# Patient Record
Sex: Female | Born: 1980 | Hispanic: No | Smoking: Current every day smoker
Health system: Southern US, Community
[De-identification: ages and names within clinical notes are randomized; demographics above are authoritative.]

## PROBLEM LIST (undated history)

## (undated) DIAGNOSIS — D649 Anemia, unspecified: Secondary | ICD-10-CM

---

## 2020-04-28 ENCOUNTER — Emergency Department: Payer: Self-pay

## 2020-04-28 ENCOUNTER — Other Ambulatory Visit: Payer: Self-pay

## 2020-04-28 DIAGNOSIS — R109 Unspecified abdominal pain: Secondary | ICD-10-CM | POA: Insufficient documentation

## 2020-04-28 DIAGNOSIS — Z5321 Procedure and treatment not carried out due to patient leaving prior to being seen by health care provider: Secondary | ICD-10-CM | POA: Insufficient documentation

## 2020-04-28 DIAGNOSIS — N898 Other specified noninflammatory disorders of vagina: Secondary | ICD-10-CM | POA: Insufficient documentation

## 2020-04-28 DIAGNOSIS — R05 Cough: Secondary | ICD-10-CM | POA: Insufficient documentation

## 2020-04-28 DIAGNOSIS — R509 Fever, unspecified: Secondary | ICD-10-CM | POA: Insufficient documentation

## 2020-04-28 LAB — CBC
HCT: 34.3 % — ABNORMAL LOW (ref 36.0–46.0)
Hemoglobin: 10.9 g/dL — ABNORMAL LOW (ref 12.0–15.0)
MCH: 18.4 pg — ABNORMAL LOW (ref 26.0–34.0)
MCHC: 31.8 g/dL (ref 30.0–36.0)
MCV: 57.8 fL — ABNORMAL LOW (ref 80.0–100.0)
Platelets: 235 10*3/uL (ref 150–400)
RBC: 5.93 MIL/uL — ABNORMAL HIGH (ref 3.87–5.11)
RDW: 16.9 % — ABNORMAL HIGH (ref 11.5–15.5)
WBC: 8.4 10*3/uL (ref 4.0–10.5)
nRBC: 0 % (ref 0.0–0.2)

## 2020-04-28 LAB — URINALYSIS, COMPLETE (UACMP) WITH MICROSCOPIC
Bilirubin Urine: NEGATIVE
Glucose, UA: NEGATIVE mg/dL
Hgb urine dipstick: NEGATIVE
Ketones, ur: NEGATIVE mg/dL
Nitrite: NEGATIVE
Protein, ur: NEGATIVE mg/dL
Specific Gravity, Urine: 1.016 (ref 1.005–1.030)
WBC, UA: >50 WBC/hpf — ABNORMAL HIGH (ref 0–5)
pH: 6 (ref 5.0–8.0)

## 2020-04-28 LAB — BASIC METABOLIC PANEL
Anion gap: 10 (ref 5–15)
BUN: 6 mg/dL (ref 6–20)
CO2: 25 mmol/L (ref 22–32)
Calcium: 9.1 mg/dL (ref 8.9–10.3)
Chloride: 104 mmol/L (ref 98–111)
Creatinine, Ser: 0.8 mg/dL (ref 0.44–1.00)
GFR calc Af Amer: >60 mL/min (ref >60)
GFR calc non Af Amer: >60 mL/min (ref >60)
Glucose, Bld: 100 mg/dL — ABNORMAL HIGH (ref 70–99)
Potassium: 3.6 mmol/L (ref 3.5–5.1)
Sodium: 139 mmol/L (ref 135–145)

## 2020-04-28 LAB — TROPONIN I (HIGH SENSITIVITY): Troponin I (High Sensitivity): <2 ng/L (ref <18)

## 2020-04-28 LAB — POCT PREGNANCY, URINE: Preg Test, Ur: NEGATIVE

## 2020-04-28 NOTE — ED Triage Notes (Signed)
PT to ED from home c/o coughing so hard that she pees, orange urine,  fever, white vaginal discharge with a little bit of odor, side/pelvid cramps, and 1 episode of vomiting. PT boyfriend here with similar sx, neither have been checked for STD's since getting together.

## 2020-04-29 ENCOUNTER — Ambulatory Visit: Payer: Self-pay | Admitting: Physician Assistant

## 2020-04-29 ENCOUNTER — Emergency Department
Admission: EM | Admit: 2020-04-29 | Discharge: 2020-04-29 | Disposition: A | Discharge status: Home / Self Care | Payer: Self-pay | Attending: Emergency Medicine | Admitting: Emergency Medicine

## 2020-04-29 ENCOUNTER — Encounter: Payer: Self-pay | Admitting: Physician Assistant

## 2020-04-29 DIAGNOSIS — F1290 Cannabis use, unspecified, uncomplicated: Secondary | ICD-10-CM | POA: Insufficient documentation

## 2020-04-29 DIAGNOSIS — A549 Gonococcal infection, unspecified: Secondary | ICD-10-CM

## 2020-04-29 DIAGNOSIS — Z113 Encounter for screening for infections with a predominantly sexual mode of transmission: Secondary | ICD-10-CM

## 2020-04-29 DIAGNOSIS — Z720 Tobacco use: Secondary | ICD-10-CM | POA: Insufficient documentation

## 2020-04-29 DIAGNOSIS — A5424 Gonococcal female pelvic inflammatory disease: Secondary | ICD-10-CM

## 2020-04-29 HISTORY — DX: Anemia, unspecified: D64.9

## 2020-04-29 LAB — WET PREP FOR TRICH, YEAST, CLUE
Trichomonas Exam: NEGATIVE
Yeast Exam: NEGATIVE

## 2020-04-29 LAB — CHLAMYDIA/NGC RT PCR (ARMC ONLY)
Chlamydia Tr: NOT DETECTED
N gonorrhoeae: DETECTED — AB

## 2020-04-29 MED ORDER — METRONIDAZOLE 500 MG PO TABS: 500.0000 mg | ORAL_TABLET | Freq: Two times a day (BID) | ORAL | 0 refills | Status: AC

## 2020-04-29 MED ORDER — CEFTRIAXONE SODIUM 500 MG IJ SOLR
500.0000 mg | Freq: Once | INTRAMUSCULAR | Status: AC
  Administered 2020-04-29: 500 mg via INTRAMUSCULAR

## 2020-04-29 MED ORDER — DOXYCYCLINE HYCLATE 100 MG PO TABS: 100.0000 mg | ORAL_TABLET | Freq: Two times a day (BID) | ORAL | 0 refills | Status: AC

## 2020-04-29 NOTE — ED Notes (Signed)
Unable to locate pt when attempting to take pt to exam room. Called pt from outside lobby and inside waiting room.

## 2020-04-29 NOTE — Progress Notes (Signed)
[facility] Department STI clinic/screening visit  Subjective:  Kristina Burch is a 39 y.o. female being seen today for an STI screening visit. The patient reports they do have symptoms.  Patient reports that they do not desire a pregnancy in the next year.   They reported they are not interested in discussing contraception today.  No LMP recorded (lmp unknown).   Patient has the following medical conditions:   Patient Active Problem List   Diagnosis Date Noted  . Tobacco use 04/29/2020  . Marijuana use 04/29/2020    Chief Complaint  Patient presents with  . SEXUALLY TRANSMITTED DISEASE    STD screening    39 yo woman here for STD eval. I reviewed [facility] ED medical record and note she was tested for [facility] and Chlam in past 24 hours, with pos GC test. Pt left hospital and is unaware of these results.   Last HIV test per patient/review of record was 1-2 years ago (neg) Patient reports last pap was 2018, normal per pt.   See flowsheet for further details and programmatic requirements.    The following portions of the patient's history were reviewed and updated as appropriate: allergies, current medications, past medical history, past social history, past surgical history and problem list.  Objective:  There were no vitals filed for this visit.  Physical Exam Vitals and nursing note reviewed.  Constitutional:      Appearance: Normal appearance.     Comments: Appears tired.  HENT:     Head: Normocephalic and atraumatic.     Mouth/Throat:     Mouth: Mucous membranes are moist.     Pharynx: Oropharynx is clear. No oropharyngeal exudate or posterior oropharyngeal erythema.  Pulmonary:     Effort: Pulmonary effort is normal.  Abdominal:     General: Abdomen is flat.     Palpations: There is no mass.     Tenderness: There is abdominal tenderness in the right lower quadrant and left lower quadrant. There is no rebound.     Comments: Mild abd pain.  Genitourinary:     General: Normal vulva.     Exam position: Lithotomy position.     Pubic Area: No rash or pubic lice.      Labia:        Right: No rash or lesion.        Left: No rash or lesion.      Vagina: Normal. No vaginal discharge, erythema, bleeding or lesions.     Cervix: Cervical motion tenderness present. No discharge, friability, lesion or erythema.     Uterus: Normal. Not tender.      Adnexa: Right adnexa normal and left adnexa normal.       Right: No tenderness.         Left: No tenderness.       Rectum: Normal.     Comments: Mild CMT. Lymphadenopathy:     Head:     Right side of head: No preauricular or posterior auricular adenopathy.     Left side of head: No preauricular or posterior auricular adenopathy.     Cervical: No cervical adenopathy.     Upper Body:     Right upper body: No supraclavicular or axillary adenopathy.     Left upper body: No supraclavicular or axillary adenopathy.     Lower Body: No right inguinal adenopathy. No left inguinal adenopathy.  Skin:    General: Skin is warm and dry.     Findings: No rash.  Neurological:     Mental Status: She is alert and oriented to person, place, and time.      Assessment and Plan:  Kristina Burch is a 39 y.o. female presenting to the [facility] Department for STI screening  1. Routine screening for STI (sexually transmitted infection) - WET PREP FOR TRICH, YEAST, CLUE - HIV/HCV [facility] - HBV Antigen/[facility]ibody [facility] - Syphilis Serology, [facility] - Gonococcus culture  2. Gonorrhea and PID Counseled pt re: pos GC result from hospital testing, and current diagnosis of PID. Treat with ceftriaxone 500mg  IM 1, doxycycline 100mg  po bid for 14 days, metronidazole 500mg  bid for 14 days. Return 05/03/20 for reassessment of sx. If worsen in interim, needs ED eval. Needs annual well woman exam/Pap after current acute health issues resolve. Enc COVID vaccination.  3. Tobacco smoking Already working on  cessation, down to 1 cig/day. Enc complete cessation.     Return in 4 days (on 05/03/2020) for reassessment of PID symptoms.  Future Appointments  Date Time Provider Department Center  05/03/2020  2:00 PM [facility] PROVIDER [facility] None    05/05/20, PA-C

## 2020-04-29 NOTE — Progress Notes (Signed)
Provider orders completed. 

## 2020-05-03 ENCOUNTER — Ambulatory Visit: Payer: Self-pay

## 2020-05-04 ENCOUNTER — Encounter: Payer: Self-pay | Admitting: Family Medicine

## 2020-05-04 LAB — GONOCOCCUS CULTURE

## 2020-05-04 LAB — HEPATITIS B SURFACE ANTIGEN: Hepatitis B Surface Ag: NONREACTIVE

## 2020-05-06 LAB — HM HEPATITIS C SCREENING LAB: HM Hepatitis Screen: NEGATIVE

## 2020-05-06 LAB — HM HIV SCREENING LAB: HM HIV Screening: NEGATIVE

## 2020-08-02 ENCOUNTER — Ambulatory Visit: Payer: Self-pay

## 2020-08-03 ENCOUNTER — Encounter: Payer: Self-pay | Admitting: Physician Assistant

## 2020-08-03 ENCOUNTER — Ambulatory Visit: Payer: Self-pay | Admitting: Physician Assistant

## 2020-08-03 ENCOUNTER — Other Ambulatory Visit: Payer: Self-pay

## 2020-08-03 DIAGNOSIS — Z299 Encounter for prophylactic measures, unspecified: Secondary | ICD-10-CM

## 2020-08-03 DIAGNOSIS — B9689 Other specified bacterial agents as the cause of diseases classified elsewhere: Secondary | ICD-10-CM

## 2020-08-03 DIAGNOSIS — Z113 Encounter for screening for infections with a predominantly sexual mode of transmission: Secondary | ICD-10-CM

## 2020-08-03 LAB — WET PREP FOR TRICH, YEAST, CLUE
Trichomonas Exam: NEGATIVE
Yeast Exam: NEGATIVE

## 2020-08-03 LAB — PREGNANCY, URINE: Preg Test, Ur: NEGATIVE

## 2020-08-03 MED ORDER — AZITHROMYCIN 500 MG PO TABS
1000.0000 mg | ORAL_TABLET | Freq: Once | ORAL | Status: AC
  Administered 2020-08-03: 16:00:00 1000 mg via ORAL

## 2020-08-03 MED ORDER — METRONIDAZOLE 250 MG PO TABS: 250.0000 mg | ORAL_TABLET | Freq: Three times a day (TID) | ORAL | 0 refills | Status: AC

## 2020-08-03 MED ORDER — CEFTRIAXONE SODIUM 500 MG IJ SOLR
500.0000 mg | Freq: Once | INTRAMUSCULAR | Status: AC
  Administered 2020-08-03: 16:00:00 500 mg via INTRAMUSCULAR

## 2020-08-03 NOTE — Progress Notes (Signed)
Pt is here for STI screening. Pt reports is having some vaginal irritation again. Pt was here back in 04/2020 and was treated for PID and gonorrhea. Pt reports never came for follow up.

## 2020-08-03 NOTE — Progress Notes (Signed)
UPT is negative today. Wet mount reviewed and pt received treatment for BV per provider order with Metronidazole 250mg  (#21), 1 tab po TID x 7 days. Pt also received treatment with Azithromycin 1g po DOT, and Ceftriaxone 500mg  IM. Pt tolerated injection well. Counseled pt per provider orders and pt states understanding. Provider orders completed.

## 2020-08-04 ENCOUNTER — Encounter: Payer: Self-pay | Admitting: Physician Assistant

## 2020-08-04 NOTE — Progress Notes (Signed)
[facility] Department STI clinic/screening visit  Subjective:  Kristina Burch is a 39 y.o. female being seen today for an STI screening visit. The patient reports they do have symptoms.  Patient reports that they do not desire a pregnancy in the next year.   They reported they are not interested in discussing contraception today.  Patient's last menstrual period was 07/22/2020 (within days).   Patient has the following medical conditions:   Patient Active Problem List   Diagnosis Date Noted  . Tobacco use 04/29/2020  . Marijuana use 04/29/2020    Chief Complaint  Patient presents with  . SEXUALLY TRANSMITTED DISEASE    screening    HPI  Patient reports that she has had some "discomfort" and cramping with white discharge for 2 weeks.  Reports that a partner has had discharge from his penis and that she thinks he has GC but he has not been screened yet.  Denies chronic conditions, surgeries and regular medicines.  States she is using condoms as her BCM.  Reports last HIV test was 04/2020 and last pap was in 2018.   See flowsheet for further details and programmatic requirements.    The following portions of the patient's history were reviewed and updated as appropriate: allergies, current medications, past medical history, past social history, past surgical history and problem list.  Objective:  There were no vitals filed for this visit.  Physical Exam Constitutional:      General: She is not in acute distress.    Appearance: Normal appearance.  HENT:     Head: Normocephalic and atraumatic.     Comments: No nits,lice, or hair loss. No cervical, supraclavicular or axillary adenopathy.    Mouth/Throat:     Mouth: Mucous membranes are moist.     Pharynx: Oropharynx is clear. No oropharyngeal exudate or posterior oropharyngeal erythema.  Eyes:     Conjunctiva/sclera: Conjunctivae normal.  Pulmonary:     Effort: Pulmonary effort is normal.  Abdominal:      Palpations: Abdomen is soft. There is no mass.     Tenderness: There is no abdominal tenderness. There is no guarding or rebound.  Genitourinary:    General: Normal vulva.     Rectum: Normal.     Comments: External genitalia/pubic area without nits, lice, edema, erythema, lesions and inguinal adenopathy. Vagina with normal mucosa and small amount of white  discharge. Cervix without visible lesions. Uterus firm, mobile, nt, no masses, no CMT, no adnexal tenderness or fullness. Musculoskeletal:     Cervical back: Neck supple. No tenderness.  Skin:    General: Skin is warm and dry.     Findings: No bruising, erythema, lesion or rash.  Neurological:     Mental Status: She is alert and oriented to person, place, and time.  Psychiatric:        Mood and Affect: Mood normal.        Behavior: Behavior normal.        Thought Content: Thought content normal.        Judgment: Judgment normal.      Assessment and Plan:  Kristina Burch is a 39 y.o. female presenting to the [facility] Department for STI screening  1. Screening for STD (sexually transmitted disease) Patient into clinic with symptoms. Rec condoms with all sex. Await test results.  Counseled that RN will call if needs to RTC for treatment once results are back. - WET PREP FOR TRICH, YEAST, CLUE - Chlamydia/Gonorrhea [facility] -  HIV [facility] - Syphilis Serology, [facility] - Chlamydia/Gonorrhea [place] - Chlamydia/Gonorrhea [facility] - Pregnancy, urine  2. Prophylactic measure Will cover for GC and Chlamydia due to report of partner with symptoms. Give Ceftriaxone 500  Mg IM and Azithromycin 1 g po DOT today. No sex for 14 days and until after partner completes treatment. Call and RTC if vomits < 2 hr after taking medicine for re-treatment. - cefTRIAXone (ROCEPHIN) injection 500 mg - azithromycin (ZITHROMAX) tablet 1,000 mg  3. BV (bacterial vaginosis) Treat for BV with Metronidazole 250  mg #21 1 po TID for 7 days with food, no EtOH for 24 hr before and until 72 hr after completing medicine. No sex for 14 days and until after partner completes treatment. Enc OTC antifungal cream if has itching during or just after treatment with antibiotics. - metroNIDAZOLE (FLAGYL) 250 MG tablet; Take 1 tablet (250 mg total) by mouth 3 (three) times daily.  Dispense: 21 tablet; Refill: 0     Return if symptoms worsen or fail to improve.  No future appointments.  Matt Holmes, PA

## 2020-08-10 ENCOUNTER — Telehealth: Payer: Self-pay | Admitting: Family Medicine

## 2020-08-10 NOTE — Telephone Encounter (Signed)
RN returned call. Patient did not complete metronidazole given on 12/21 because she has been sick with fever. Patient was treated for gonorrhea on 12/21. RN gave patient [employer] covid test information and told patient to make an appointment for STD screening after she has been tested and symptom free.   Harvie Heck, RN

## 2020-08-10 NOTE — Telephone Encounter (Signed)
Pt states that she was given medication after her visit on 08/03/2020. She states that she did not take her medication correctly and she is sick with fever, body aches. She wants a nurse to call her.

## 2020-08-18 ENCOUNTER — Telehealth: Payer: Self-pay

## 2020-08-18 DIAGNOSIS — A549 Gonococcal infection, unspecified: Secondary | ICD-10-CM

## 2020-08-18 NOTE — Telephone Encounter (Signed)
TC to patient. Verified ID via password/SS#. Informed of positive GC; patient tx'd at visit.  Co to RTC in 3 months for TOC. Patient verbalized understanding. Richmond Campbell, RN

## 2022-03-15 IMAGING — CR DG CHEST 2V
1 series · 2 of 2 positions shown · non-contrast
Comparison: None.

CLINICAL DATA: Cough

EXAM:
CHEST - 2 VIEW

[Series 1: dg chest 2 view · 0.14mm/px · 2 of 2 slices shown]
[im 1/2]
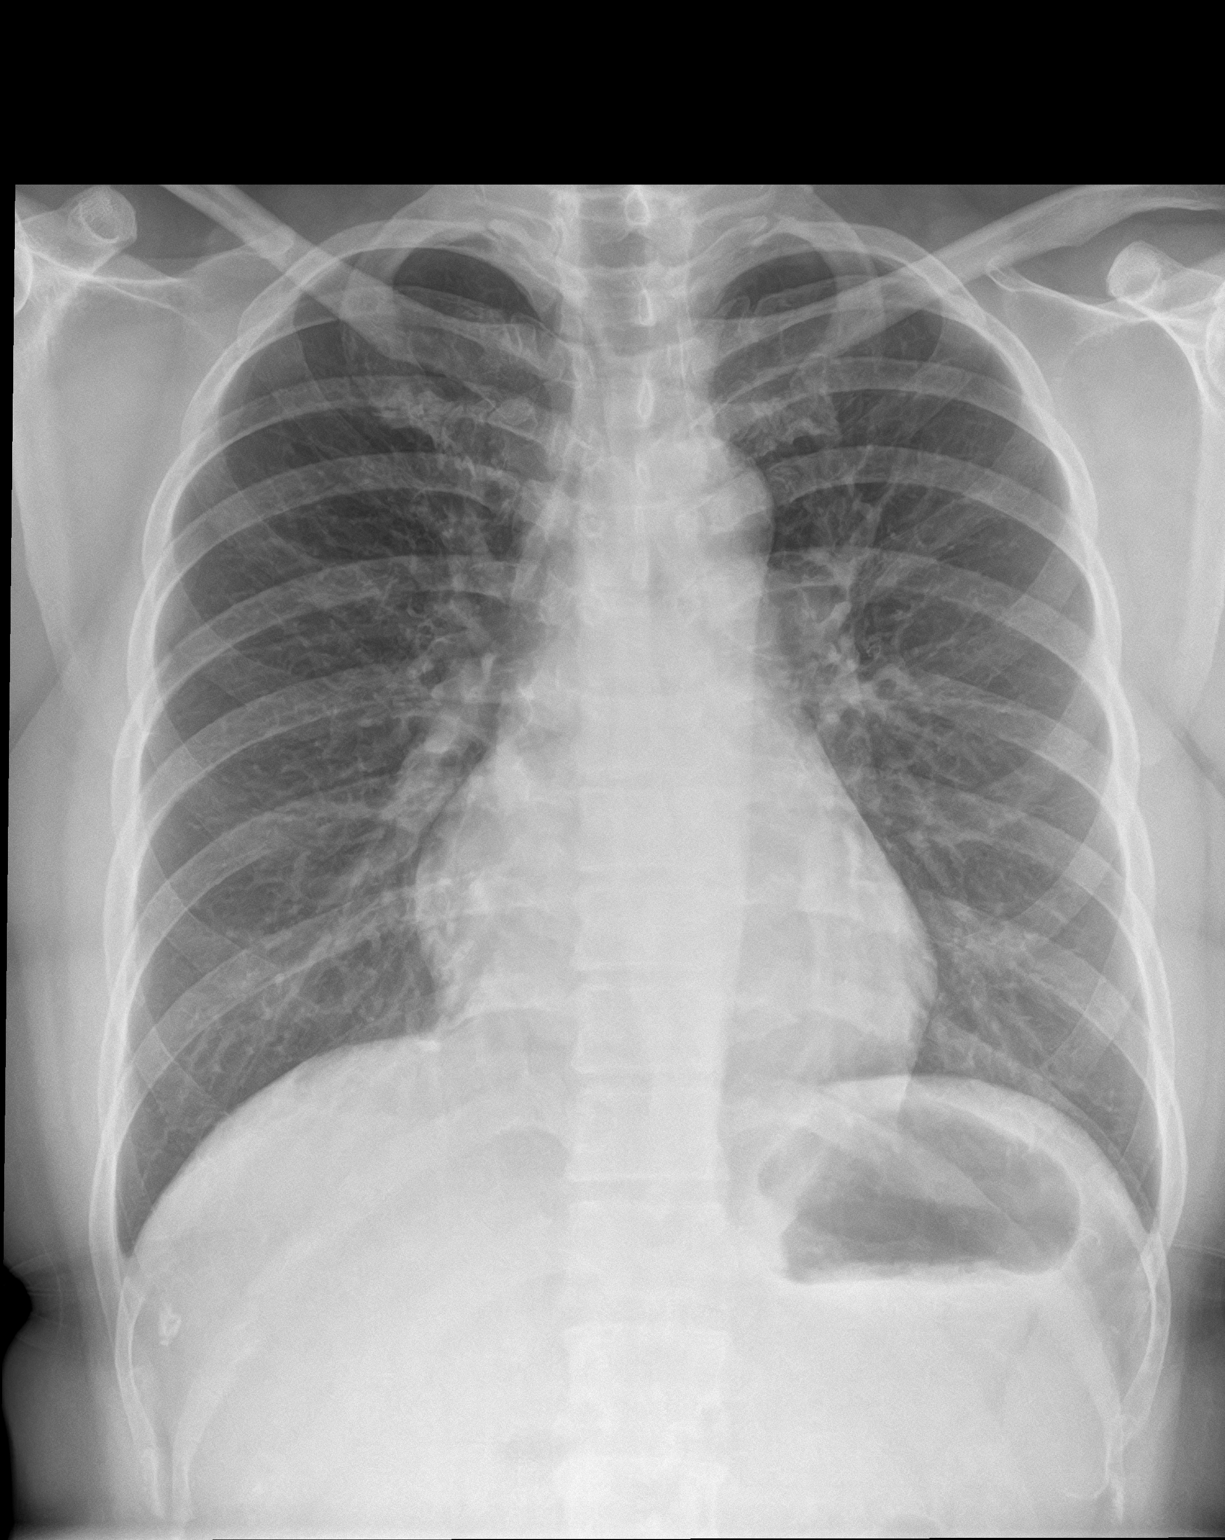
[im 2/2]
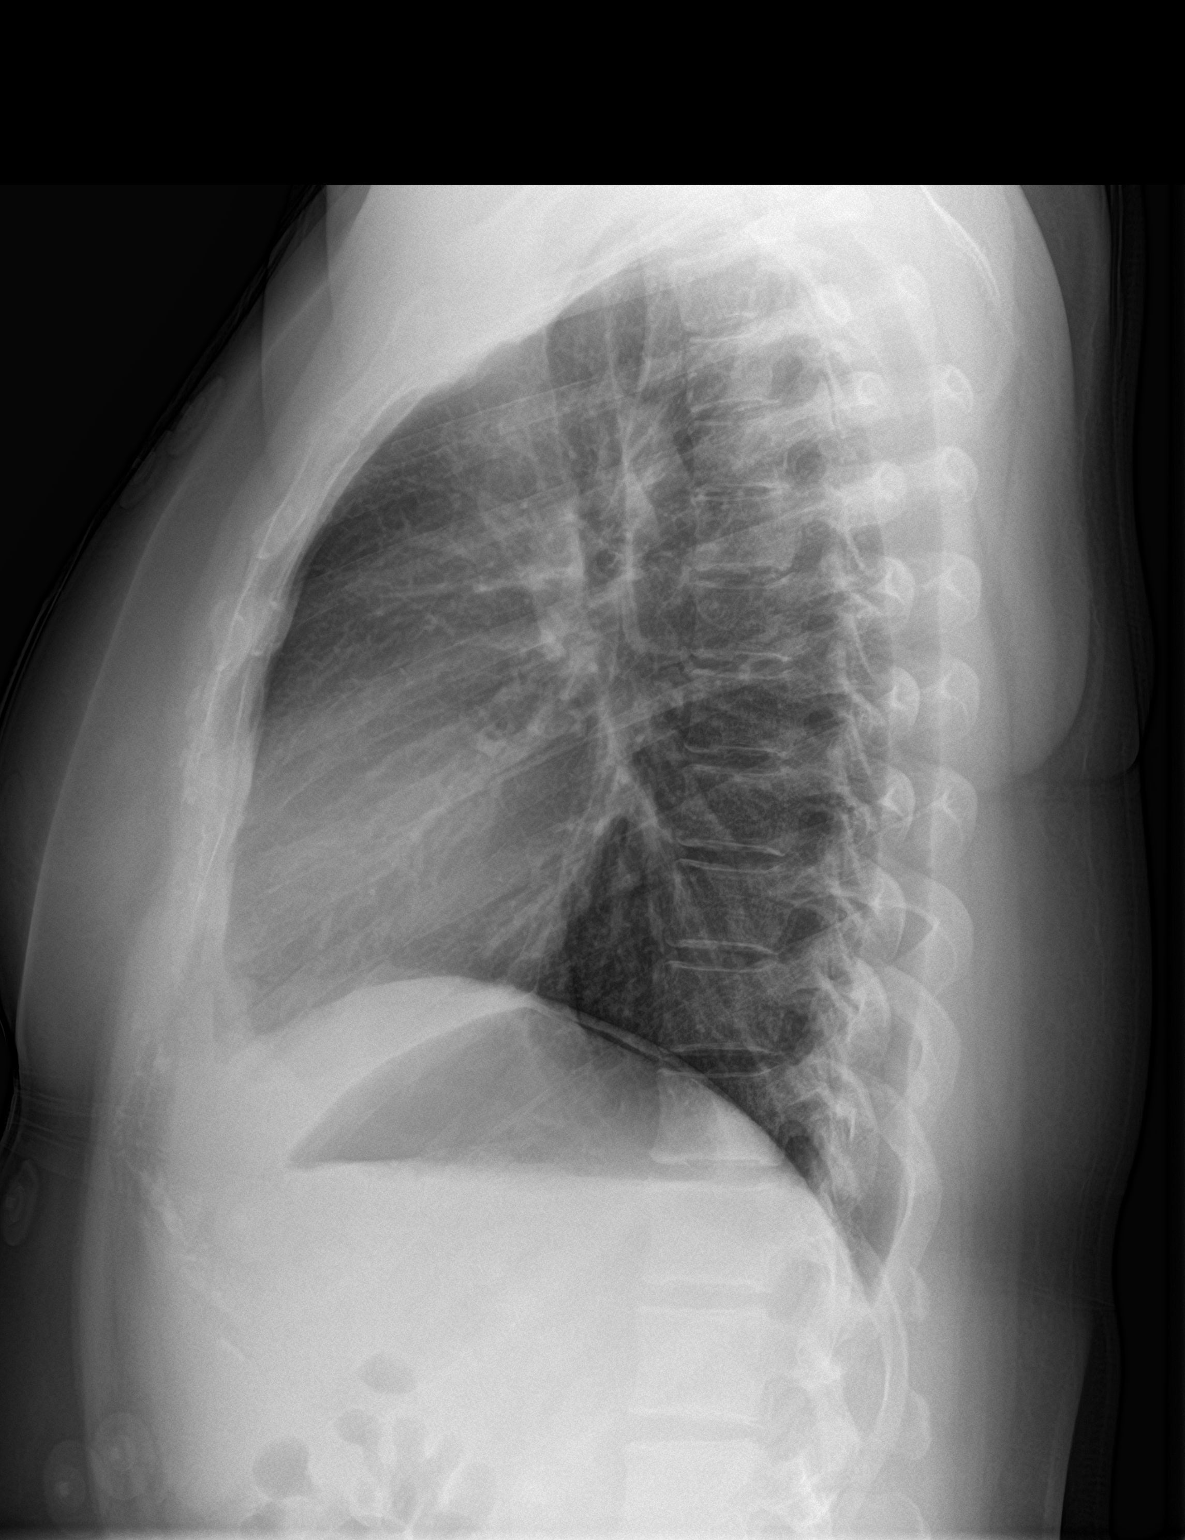

[2 of 2 positions shown; findings below may reference images not displayed]

FINDINGS: Normal heart size. Normal mediastinal contour. No pneumothorax. No
pleural effusion. Lungs appear clear, with no acute consolidative
airspace disease and no pulmonary edema.
IMPRESSION: No active cardiopulmonary disease.
